# Patient Record
Sex: Male | Born: 1970 | Hispanic: Yes | Smoking: Never smoker
Health system: Southern US, Community
[De-identification: ages and names within clinical notes are randomized; demographics above are authoritative.]

## PROBLEM LIST (undated history)

## (undated) DIAGNOSIS — I10 Essential (primary) hypertension: Secondary | ICD-10-CM

## (undated) DIAGNOSIS — E119 Type 2 diabetes mellitus without complications: Secondary | ICD-10-CM

## (undated) DIAGNOSIS — N289 Disorder of kidney and ureter, unspecified: Secondary | ICD-10-CM

---

## 2021-07-24 ENCOUNTER — Emergency Department: Payer: Self-pay

## 2021-07-24 ENCOUNTER — Emergency Department
Admission: EM | Admit: 2021-07-24 | Discharge: 2021-07-24 | Disposition: A | Discharge status: Home / Self Care | Payer: Self-pay | Attending: Emergency Medicine | Admitting: Emergency Medicine

## 2021-07-24 ENCOUNTER — Encounter: Payer: Self-pay | Admitting: Emergency Medicine

## 2021-07-24 ENCOUNTER — Other Ambulatory Visit: Payer: Self-pay

## 2021-07-24 DIAGNOSIS — J069 Acute upper respiratory infection, unspecified: Secondary | ICD-10-CM | POA: Insufficient documentation

## 2021-07-24 HISTORY — DX: Essential (primary) hypertension: I10

## 2021-07-24 HISTORY — DX: Disorder of kidney and ureter, unspecified: N28.9

## 2021-07-24 HISTORY — DX: Type 2 diabetes mellitus without complications: E11.9

## 2021-07-24 MED ORDER — ONDANSETRON 4 MG PO TBDP
4.0000 mg | ORAL_TABLET | Freq: Once | ORAL | Status: AC
  Administered 2021-07-24: 4 mg via ORAL
  Filled 2021-07-24: qty 1

## 2021-07-24 MED ORDER — HYDROCOD POLI-CHLORPHE POLI ER 10-8 MG/5ML PO SUER: 5.0000 mL | Freq: Two times a day (BID) | ORAL | 0 refills | Status: AC | PRN

## 2021-07-24 NOTE — ED Notes (Signed)
Pt to ED via POV with family stating that he is having fever of 103 this morning, cough, headache, and sore throat. Pt took flu medication last night but has not had any medication this morning. Pt is a peritoneal dialysis pt, last did his treatment last night.   Family made aware of visitation policy.

## 2021-07-24 NOTE — ED Triage Notes (Signed)
Pt here with a cough for 9 days with phlegm and a sore throat. Pt also has a HA. Pt states a 103 fever at home. Pt also having nausea.

## 2021-07-24 NOTE — ED Provider Notes (Signed)
[facility] Provider Note    Event Date/Time   First MD Initiated Contact with Patient 07/24/21 [phone]     (approximate)   History   Fever and Cough   HPI  Thomas Key is a 51 y.o. male   presents to the ED with family complaining of cough for 9 days along with sore throat, headache and fever of 103 this morning.  Patient also complains of nausea but denies any vomiting or diarrhea.  One other family member has similar symptoms in the household.  Patient did get the COVID-vaccine and also was tested for COVID 4 days ago at [facility] which was negative.  Patient denies any previous history of bronchitis or pneumonia.  Patient has history of diabetes and takes Glucotrol XL, history of hypertension and renal disorder      Physical Exam   Triage Vital Signs: ED Triage Vitals  Enc Vitals Group     BP 07/24/21 0946 (!) 134/97     Pulse Rate 07/24/21 0946 66     Resp 07/24/21 0946 16     Temp 07/24/21 0946 98.7 F (37.1 C)     Temp Source 07/24/21 0946 Oral     SpO2 07/24/21 0946 99 %     Weight 07/24/21 0949 186 lb (84.4 kg)     Height 07/24/21 0949 5\' 6"  (1.676 m)     Head Circumference --      Peak Flow --      Pain Score 07/24/21 0949 10     Pain Loc --      Pain Edu? --      Excl. in GC? --     Most recent vital signs: Vitals:   07/24/21 0946  BP: (!) 134/97  Pulse: 66  Resp: 16  Temp: 98.7 F (37.1 C)  SpO2: 99%     General: Awake, no distress.  Alert, talkative, coughing. CV:  Good peripheral perfusion.  Heart regular rate and rhythm. Resp:  Normal effort.  Lungs are clear bilaterally with congested cough heard frequently. Abd:  No distention.  Other:     ED Results / Procedures / Treatments   Labs (all labs ordered are listed, but only abnormal results are displayed) Labs Reviewed - No data to display    RADIOLOGY Chest x-ray 2 view images were reviewed and interpreted by myself as being negative for infiltrate.  Radiology  report is negative for acute cardiopulmonary disease.    PROCEDURES:  Critical Care performed:   Procedures   MEDICATIONS ORDERED IN ED: Medications  ondansetron (ZOFRAN-ODT) disintegrating tablet 4 mg (4 mg Oral Given 07/24/21 1030)     IMPRESSION / MDM / ASSESSMENT AND PLAN / ED COURSE  I reviewed the triage vital signs and the nursing notes.   Differential diagnosis includes, but is not limited to, bronchitis, viral upper respiratory infection, pneumonia.  51 year old male presents to the ED with complaint of cough for the last 9 days that is productive in nature.  Patient states he also has a headache from coughing and reports a fever of 103 at home.  He also reports nausea without vomiting.  He was seen at [facility] the first of the week at which time his COVID swab was negative and patient was prescribed Tessalon Perles which is not helping with his cough.  Chest x-ray noted today without evidence of acute cardiopulmonary changes and was reassuring.  Patient was afebrile and had an O2 sat of 99%.  I discussed with  family and patient with use of the Spanish interpreter on [employer].  He will discontinue taking the Tessalon as he states it is not helping with his cough and began taking Tussionex twice daily as needed for cough.  He was made aware that this medication could cause drowsiness and increase his risk for falling.  He will also follow-up with his PCP if any continued problems or not improving.  Over the weekend he is to return to the emergency department if any severe worsening of his symptoms.      Patient's presentation is most consistent with acute illness / injury with system symptoms.  FINAL CLINICAL IMPRESSION(S) / ED DIAGNOSES   Final diagnoses:  Viral URI with cough     Rx / DC Orders   ED Discharge Orders          Ordered    chlorpheniramine-HYDROcodone (TUSSIONEX PENNKINETIC ER) 10-8 MG/5ML  Every 12 hours PRN        07/24/21 1136             Note:   This document was prepared using Dragon voice recognition software and may include unintentional dictation errors.   Tommi Rumps, PA-C 07/24/21 1412    Minna Antis, MD 07/24/21 1414

## 2021-07-24 NOTE — ED Notes (Signed)
See triage note  per family he has had cough and chest discomfort for the past 9 days  also has had fever   states cough is productive  greenish phlegm   fever at home was 103  afebrile on arrival   also has h/a

## 2021-07-24 NOTE — Discharge Instructions (Signed)
Follow-up with your primary care provider if any continued problems.  Discontinue taking the [employer] as they are not working for your cough.  A prescription for Tussionex was sent to the pharmacy.  As we discussed do not drive or operate machinery while taking this medication as it could cause drowsiness.  Increase fluids.  Continue with your regular medication as prescribed by your primary care provider.

## 2022-07-16 ENCOUNTER — Emergency Department
Admission: EM | Admit: 2022-07-16 | Discharge: 2022-07-16 | Disposition: A | Discharge status: Home / Self Care | Payer: Medicare Other | Attending: Emergency Medicine | Admitting: Emergency Medicine

## 2022-07-16 ENCOUNTER — Other Ambulatory Visit: Payer: Self-pay

## 2022-07-16 ENCOUNTER — Emergency Department: Payer: Medicare Other

## 2022-07-16 DIAGNOSIS — E119 Type 2 diabetes mellitus without complications: Secondary | ICD-10-CM | POA: Diagnosis not present

## 2022-07-16 DIAGNOSIS — R519 Headache, unspecified: Secondary | ICD-10-CM

## 2022-07-16 DIAGNOSIS — Z992 Dependence on renal dialysis: Secondary | ICD-10-CM | POA: Insufficient documentation

## 2022-07-16 DIAGNOSIS — I10 Essential (primary) hypertension: Secondary | ICD-10-CM | POA: Diagnosis not present

## 2022-07-16 LAB — CBC WITH DIFFERENTIAL/PLATELET
Abs Immature Granulocytes: 0.04 10*3/uL (ref 0.00–0.07)
Basophils Absolute: 0.1 10*3/uL (ref 0.0–0.1)
Basophils Relative: 1 %
Eosinophils Absolute: 0.2 10*3/uL (ref 0.0–0.5)
Eosinophils Relative: 2 %
HCT: 36.7 % — ABNORMAL LOW (ref 39.0–52.0)
Hemoglobin: 12.1 g/dL — ABNORMAL LOW (ref 13.0–17.0)
Immature Granulocytes: 0 %
Lymphocytes Relative: 16 %
Lymphs Abs: 1.5 10*3/uL (ref 0.7–4.0)
MCH: 31.5 pg (ref 26.0–34.0)
MCHC: 33 g/dL (ref 30.0–36.0)
MCV: 95.6 fL (ref 80.0–100.0)
Monocytes Absolute: 0.6 10*3/uL (ref 0.1–1.0)
Monocytes Relative: 7 %
Neutro Abs: 6.9 10*3/uL (ref 1.7–7.7)
Neutrophils Relative %: 74 %
Platelets: 182 10*3/uL (ref 150–400)
RBC: 3.84 MIL/uL — ABNORMAL LOW (ref 4.22–5.81)
RDW: 12.9 % (ref 11.5–15.5)
WBC: 9.2 10*3/uL (ref 4.0–10.5)
nRBC: 0 % (ref 0.0–0.2)

## 2022-07-16 LAB — COMPREHENSIVE METABOLIC PANEL
ALT: 23 U/L (ref 0–44)
AST: 18 U/L (ref 15–41)
Albumin: 4.1 g/dL (ref 3.5–5.0)
Alkaline Phosphatase: 180 U/L — ABNORMAL HIGH (ref 38–126)
Anion gap: 18 — ABNORMAL HIGH (ref 5–15)
BUN: 65 mg/dL — ABNORMAL HIGH (ref 6–20)
CO2: 24 mmol/L (ref 22–32)
Calcium: 8.8 mg/dL — ABNORMAL LOW (ref 8.9–10.3)
Chloride: 91 mmol/L — ABNORMAL LOW (ref 98–111)
Creatinine, Ser: 15.94 mg/dL — ABNORMAL HIGH (ref 0.61–1.24)
GFR, Estimated: 3 mL/min — ABNORMAL LOW (ref >60)
Glucose, Bld: 157 mg/dL — ABNORMAL HIGH (ref 70–99)
Potassium: 4.3 mmol/L (ref 3.5–5.1)
Sodium: 133 mmol/L — ABNORMAL LOW (ref 135–145)
Total Bilirubin: 0.8 mg/dL (ref 0.3–1.2)
Total Protein: 7.5 g/dL (ref 6.5–8.1)

## 2022-07-16 MED ORDER — KETOROLAC TROMETHAMINE 15 MG/ML IJ SOLN
15.0000 mg | Freq: Once | INTRAMUSCULAR | Status: AC
  Administered 2022-07-16: 15 mg via INTRAVENOUS
  Filled 2022-07-16: qty 1

## 2022-07-16 NOTE — ED Provider Notes (Signed)
[facility] Provider Note    Event Date/Time   First MD Initiated Contact with Patient 07/16/22 1158     (approximate)   History   Headache   HPI  Thomas Key is a 52 y.o. male with a past medical history of diabetes, hypertension, on daily peritoneal dialysis, who presents today for evaluation of headache for the past 2 days.  Patient reports that the headache is constant, though intermittently gets better and worse.  He is unable to identify anything that makes it better or worse.  He reports that he has overall gradually been getting worse.  He denies any vision changes, dizziness, neck pain or stiffness, fevers or chills.  No ear pain.  He took Tylenol without improvement of his symptoms.  He reports that he took his blood pressure and noticed that it was 198 systolic when he had a headache.  There are no problems to display for this patient.         Physical Exam   Triage Vital Signs: ED Triage Vitals [07/16/22 1137]  Enc Vitals Group     BP (!) 129/93     Pulse Rate (!) 102     Resp 20     Temp 97.7 F (36.5 C)     Temp src      SpO2 98 %     Weight      Height      Head Circumference      Peak Flow      Pain Score      Pain Loc      Pain Edu?      Excl. in GC?     Most recent vital signs: Vitals:   07/16/22 1137  BP: (!) 129/93  Pulse: (!) 102  Resp: 20  Temp: 97.7 F (36.5 C)  SpO2: 98%    Physical Exam Vitals and nursing note reviewed.  Constitutional:      General: Awake and alert. No acute distress.    Appearance: Normal appearance. The patient is normal weight.  HENT:     Head: Normocephalic and atraumatic.     Mouth: Mucous membranes are moist.  TMs clear bilaterally, no mastoid tenderness or erythema Eyes:     General: PERRL. Normal EOMs.  No temporal artery tenderness        Right eye: No discharge.        Left eye: No discharge.     Conjunctiva/sclera: Conjunctivae normal.  Cardiovascular:     Rate and  Rhythm: Normal rate    Pulses: Normal pulses.  Pulmonary:     Effort: Pulmonary effort is normal. No respiratory distress.     Breath sounds: Normal breath sounds.  Abdominal:     Abdomen is soft. There is no abdominal tenderness. No rebound or guarding. No distention. Musculoskeletal:        General: No swelling. Normal range of motion.     Cervical back: Normal range of motion and neck supple.  Skin:    General: Skin is warm and dry.     Capillary Refill: Capillary refill takes less than 2 seconds.     Findings: No rash.  Neurological:     Mental Status: The patient is awake and alert.   Neurological: GCS 15 alert and oriented x3 Normal speech, no expressive or receptive aphasia or dysarthria Cranial nerves II through XII intact Normal visual fields 5 out of 5 strength in all 4 extremities with intact sensation throughout No  extremity drift Normal finger-to-nose testing, no limb or truncal ataxia   ED Results / Procedures / Treatments   Labs (all labs ordered are listed, but only abnormal results are displayed) Labs Reviewed  CBC WITH DIFFERENTIAL/PLATELET - Abnormal; Notable for the following components:      Result Value   RBC 3.84 (*)    Hemoglobin 12.1 (*)    HCT 36.7 (*)    All other components within normal limits  COMPREHENSIVE METABOLIC PANEL - Abnormal; Notable for the following components:   Sodium 133 (*)    Chloride 91 (*)    Glucose, Bld 157 (*)    BUN 65 (*)    Creatinine, Ser 15.94 (*)    Calcium 8.8 (*)    Alkaline Phosphatase 180 (*)    GFR, Estimated 3 (*)    Anion gap 18 (*)    All other components within normal limits     EKG     RADIOLOGY I independently reviewed and interpreted imaging and agree with radiologists findings.     PROCEDURES:  Critical Care performed:   Procedures   MEDICATIONS ORDERED IN ED: Medications  ketorolac (TORADOL) 15 MG/ML injection 15 mg (15 mg Intravenous Given 07/16/22 1255)     IMPRESSION /  MDM / ASSESSMENT AND PLAN / ED COURSE  I reviewed the triage vital signs and the nursing notes.   Differential diagnosis includes, but is not limited to, migraine, intracranial hemorrhage, tension headache.  Patient is awake and alert, hemodynamically stable and afebrile.  Patient presented with a chief concern of a headache. Gradual in onset, without history or physical exam findings to suggest encephalopathy; no altered mental status, fever or meningismus, vision changes, vomiting or focal neurological deficit and improved with treatment in the emergency department. Therefore, I have low suspicion for concerning process that would require urgent or emergent imaging or diagnostic/therapeutic procedural intervention such as lumbar puncture. Doubt meningitis as there is no fever, photophobia, neck symptoms, altered mental status.  No history of trauma, doubt subdural or epidural hematoma. No dizziness or other neurologic symptoms so cerebellar infarction or other hemorrhagic stroke are unlikely. Intracranial mass unlikely given that the headache is not getting progressively worse, is not worse in the morning, there are no other neurologic symptoms, and the neurologic exam is grossly normal. Unlikely to be giant cell arteritis as there is no tenderness over temporal artery or vision changes. Doubt CO toxicity as no known exposure and no other family members have a headache. No neck pain and was not sudden onset or associated with movement of the neck and no dizziness,  doubt carotid artery dissection. No occipital tenderness so occipital neuralgia seems less likely.  Given that he does not typically get headaches, CT head obtained is negative.  He was treated symptomatically with resolution of his headache.  He was given Toradol given that he has dialysis every day and kidney damage is not an acute concern given that he is already on dialysis.  We discussed that it is possible that it was due to his blood  pressure given that when he had a headache he took his blood pressure and it was 198 systolic.  His blood pressure is reasonable here.  He has no focal neurological deficits, and is quite well in appearance.  I discussed the importance of close outpatient follow-up and strict return precautions.  Patient understands and agrees with plan.  He was discharged in stable condition.     Patient's presentation is most  consistent with acute complicated illness / injury requiring diagnostic workup.   Clinical Course as of 07/16/22 1526  Fri Jul 16, 2022  1303 Patient reports headache has improved [JP]    Clinical Course User Index [JP] Nalanie Winiecki, Herb Grays, PA-C     FINAL CLINICAL IMPRESSION(S) / ED DIAGNOSES   Final diagnoses:  Acute nonintractable headache, unspecified headache type     Rx / DC Orders   ED Discharge Orders     None        Note:  This document was prepared using Dragon voice recognition software and may include unintentional dictation errors.   Jackelyn Hoehn, PA-C 07/16/22 1526    Corena Herter, MD 07/16/22 1538

## 2022-07-16 NOTE — ED Triage Notes (Addendum)
Pt c/o headache on right side of head and mild blurry vision x3 days. Upper and lower strength equal bilaterally, PERRLA noted. Pt denies dizziness, CP, N/V/D. BGL was 123 at home today, pt denies history of migraines- is dialysis pt.

## 2022-07-16 NOTE — Discharge Instructions (Signed)
Your CT scan was normal.  Please follow-up with your outpatient provider.  Please return for any new, worsening, or changes symptoms or other concerns including worsening headache, visual changes, numbness, tingling, weakness, vomiting, neck pain, dizziness, or any other concerns.

## 2022-08-26 ENCOUNTER — Emergency Department: Payer: Medicare Other

## 2022-08-26 ENCOUNTER — Other Ambulatory Visit: Payer: Self-pay

## 2022-08-26 ENCOUNTER — Emergency Department
Admission: EM | Admit: 2022-08-26 | Discharge: 2022-08-26 | Disposition: A | Discharge status: Home / Self Care | Payer: Medicare Other | Attending: Emergency Medicine | Admitting: Emergency Medicine

## 2022-08-26 DIAGNOSIS — N186 End stage renal disease: Secondary | ICD-10-CM | POA: Diagnosis not present

## 2022-08-26 DIAGNOSIS — I120 Hypertensive chronic kidney disease with stage 5 chronic kidney disease or end stage renal disease: Secondary | ICD-10-CM | POA: Insufficient documentation

## 2022-08-26 DIAGNOSIS — R0789 Other chest pain: Secondary | ICD-10-CM | POA: Diagnosis present

## 2022-08-26 LAB — CBC
HCT: 32.6 % — ABNORMAL LOW (ref 39.0–52.0)
Hemoglobin: 10.7 g/dL — ABNORMAL LOW (ref 13.0–17.0)
MCH: 31.1 pg (ref 26.0–34.0)
MCHC: 32.8 g/dL (ref 30.0–36.0)
MCV: 94.8 fL (ref 80.0–100.0)
Platelets: 179 10*3/uL (ref 150–400)
RBC: 3.44 MIL/uL — ABNORMAL LOW (ref 4.22–5.81)
RDW: 12.8 % (ref 11.5–15.5)
WBC: 9.1 10*3/uL (ref 4.0–10.5)
nRBC: 0 % (ref 0.0–0.2)

## 2022-08-26 LAB — BASIC METABOLIC PANEL
Anion gap: 18 — ABNORMAL HIGH (ref 5–15)
BUN: 53 mg/dL — ABNORMAL HIGH (ref 6–20)
CO2: 24 mmol/L (ref 22–32)
Calcium: 8.3 mg/dL — ABNORMAL LOW (ref 8.9–10.3)
Chloride: 88 mmol/L — ABNORMAL LOW (ref 98–111)
Creatinine, Ser: 16.57 mg/dL — ABNORMAL HIGH (ref 0.61–1.24)
GFR, Estimated: 3 mL/min — ABNORMAL LOW (ref >60)
Glucose, Bld: 106 mg/dL — ABNORMAL HIGH (ref 70–99)
Potassium: 3.7 mmol/L (ref 3.5–5.1)
Sodium: 130 mmol/L — ABNORMAL LOW (ref 135–145)

## 2022-08-26 LAB — TROPONIN I (HIGH SENSITIVITY)
Troponin I (High Sensitivity): 22 ng/L — ABNORMAL HIGH (ref <18)
Troponin I (High Sensitivity): 23 ng/L — ABNORMAL HIGH (ref <18)

## 2022-08-26 NOTE — ED Triage Notes (Signed)
Pt to ED via POV c/o central CP that started suddenly ago. Pt reports pressure in middle of chest taht doesn't radiate anywhere and worsens with movement. Pt is dialysis pt, does dialysis every night. Endorses some nausea. Denies SOB, fevers, dizziness.

## 2022-08-26 NOTE — ED Provider Notes (Signed)
[facility] Provider Note    Event Date/Time   First MD Initiated Contact with Patient 08/26/22 2152     (approximate)   History   Chest Pain   HPI  Thomas Key is a 52 y.o. male with a history of high blood pressure and end-stage renal disease presents with complaints of chest pain.  Patient reports that started about 30 minutes prior to arrival.  He notes pain is worse when he stretches out his chest.  Denies shortness of breath, no lower extremity swelling or pain.  No history of heart disease reported.  No fevers chills or cough.     Physical Exam   Triage Vital Signs: ED Triage Vitals  Encounter Vitals Group     BP 08/26/22 1946 (!) 173/110     Systolic BP Percentile --      Diastolic BP Percentile --      Pulse Rate 08/26/22 1946 93     Resp 08/26/22 1946 18     Temp 08/26/22 1946 98.3 F (36.8 C)     Temp Source 08/26/22 1946 Oral     SpO2 08/26/22 1946 100 %     Weight 08/26/22 1947 83.9 kg (185 lb)     Height 08/26/22 1947 1.676 m (5\' 6" )     Head Circumference --      Peak Flow --      Pain Score 08/26/22 1946 8     Pain Loc --      Pain Education --      Exclude from Growth Chart --     Most recent vital signs: Vitals:   08/26/22 1946 08/26/22 2230  BP: (!) 173/110 (!) 196/118  Pulse: 93 88  Resp: 18 18  Temp: 98.3 F (36.8 C)   SpO2: 100% 100%     General: Awake, no distress.  CV:  Good peripheral perfusion.  Mild chest wall tenderness to palpation around the sternum bilaterally Resp:  Normal effort.  Abd:  No distention.  Other:  No lower extremity swelling   ED Results / Procedures / Treatments   Labs (all labs ordered are listed, but only abnormal results are displayed) Labs Reviewed  BASIC METABOLIC PANEL - Abnormal; Notable for the following components:      Result Value   Sodium 130 (*)    Chloride 88 (*)    Glucose, Bld 106 (*)    BUN 53 (*)    Creatinine, Ser 16.57 (*)    Calcium 8.3 (*)    GFR,  Estimated 3 (*)    Anion gap 18 (*)    All other components within normal limits  CBC - Abnormal; Notable for the following components:   RBC 3.44 (*)    Hemoglobin 10.7 (*)    HCT 32.6 (*)    All other components within normal limits  TROPONIN I (HIGH SENSITIVITY) - Abnormal; Notable for the following components:   Troponin I (High Sensitivity) 22 (*)    All other components within normal limits  TROPONIN I (HIGH SENSITIVITY) - Abnormal; Notable for the following components:   Troponin I (High Sensitivity) 23 (*)    All other components within normal limits     EKG  ED ECG REPORT I, Jene Every, the attending physician, personally viewed and interpreted this ECG.  Date: 08/26/2022  Rhythm: normal sinus rhythm QRS Axis: normal Intervals: normal ST/T Wave abnormalities: normal Narrative Interpretation: no evidence of acute ischemia    RADIOLOGY Chest x-ray  viewed interpret by me, no acute abnormality    PROCEDURES:  Critical Care performed:   Procedures   MEDICATIONS ORDERED IN ED: Medications - No data to display   IMPRESSION / MDM / ASSESSMENT AND PLAN / ED COURSE  I reviewed the triage vital signs and the nursing notes. Patient's presentation is most consistent with acute presentation with potential threat to life or bodily function.  Patient presents with chest pain as detailed above, differential includes musculoskeletal chest pain, ACS, pneumonia  EKG is reassuring, initial high sensitive troponin is mildly elevated.  Suspect mildly elevated troponin related to end-stage renal disease, will obtain delta troponin  In room patient is asymptomatic and states his chest pain has resolved  Delta troponin is unchanged from prior.  Appropriate for discharge at this time with cardiology follow-up, strict return precautions discussed, patient agrees with this plan.        FINAL CLINICAL IMPRESSION(S) / ED DIAGNOSES   Final diagnoses:  Atypical chest  pain     Rx / DC Orders   ED Discharge Orders     None        Note:  This document was prepared using Dragon voice recognition software and may include unintentional dictation errors.   Jene Every, MD 08/26/22 [phone]

## 2023-03-12 IMAGING — CR DG CHEST 2V
2 series · 2 of 2 positions shown · non-contrast
Comparison: None

CLINICAL DATA: Cough, fever

EXAM:
CHEST - 2 VIEW

[chest pa]
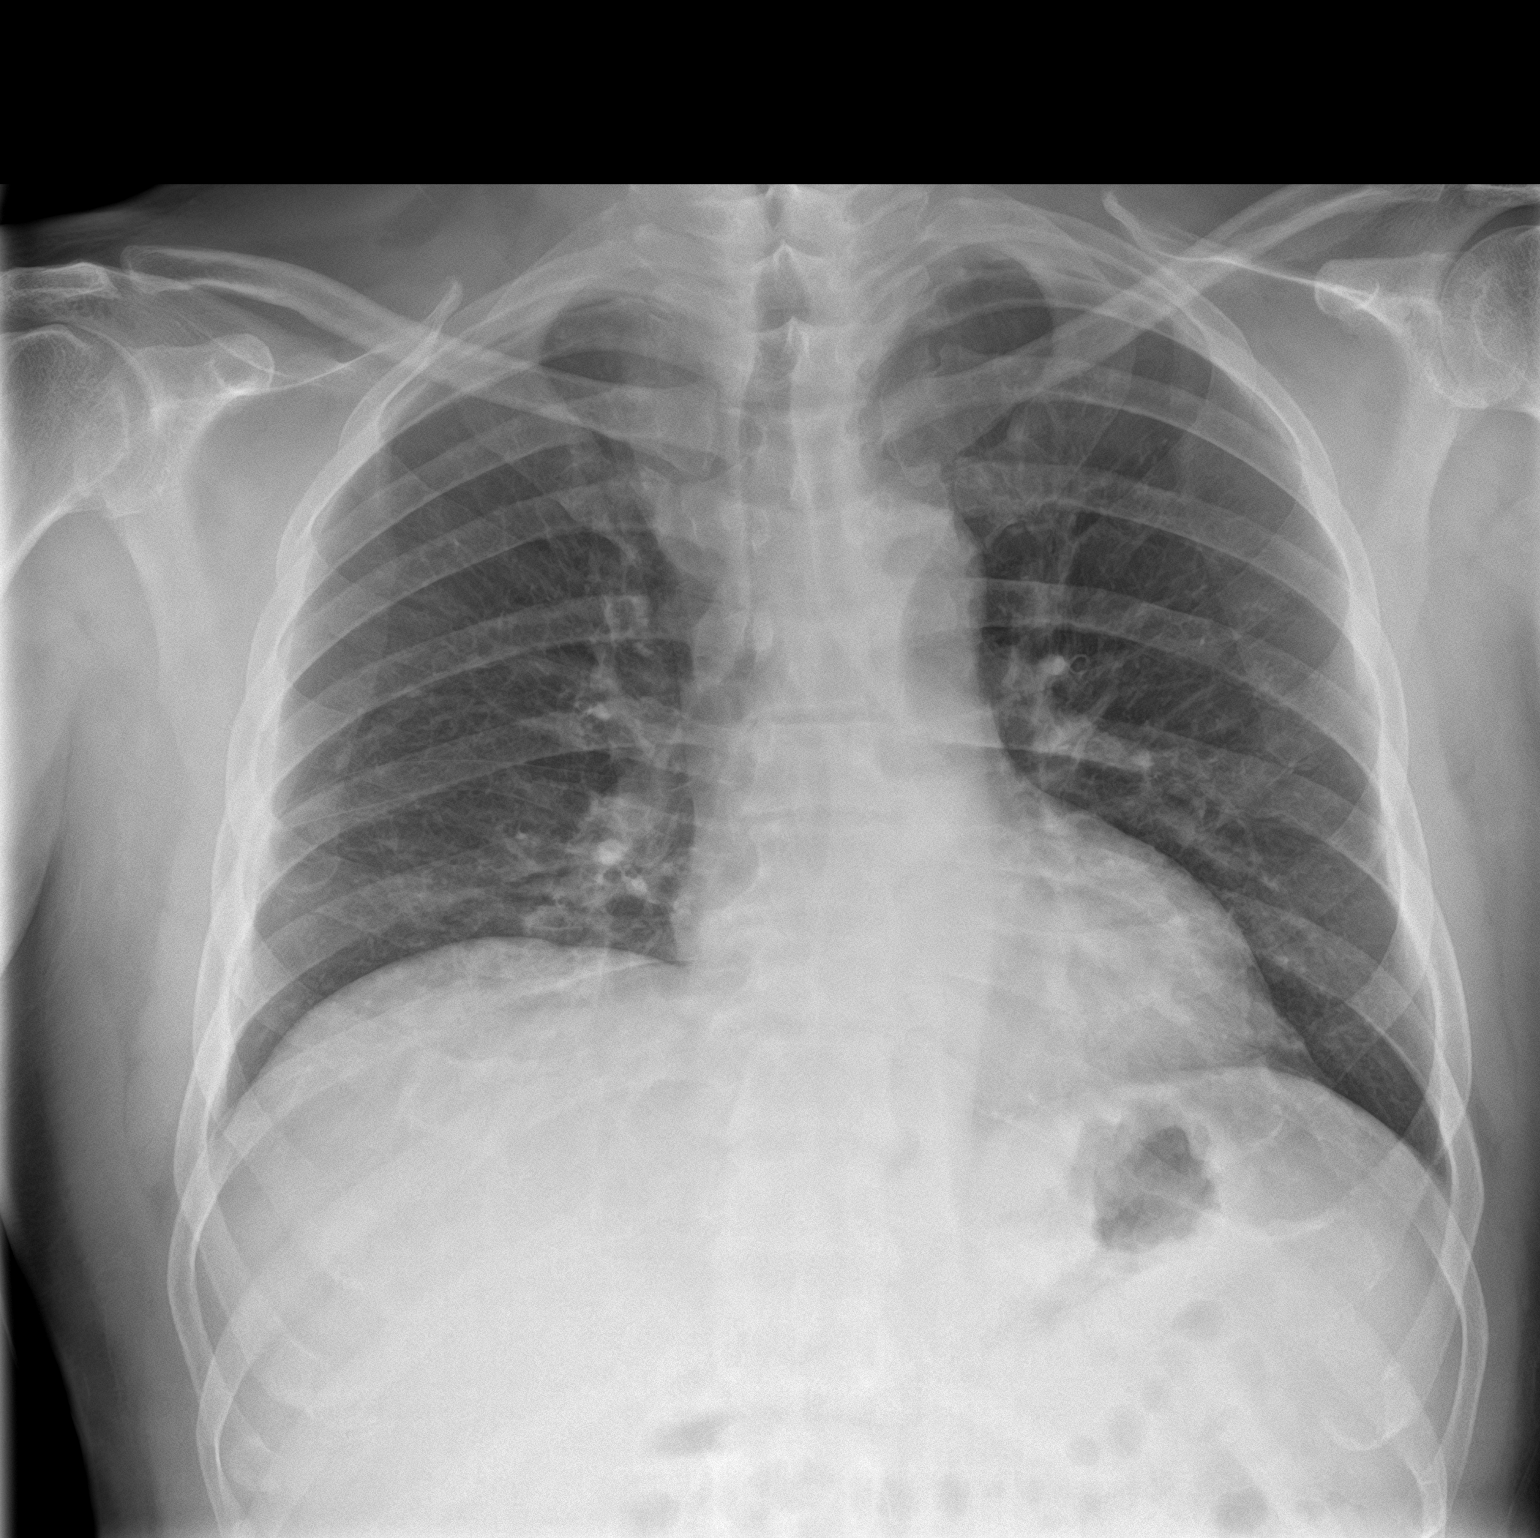

[chest lat]
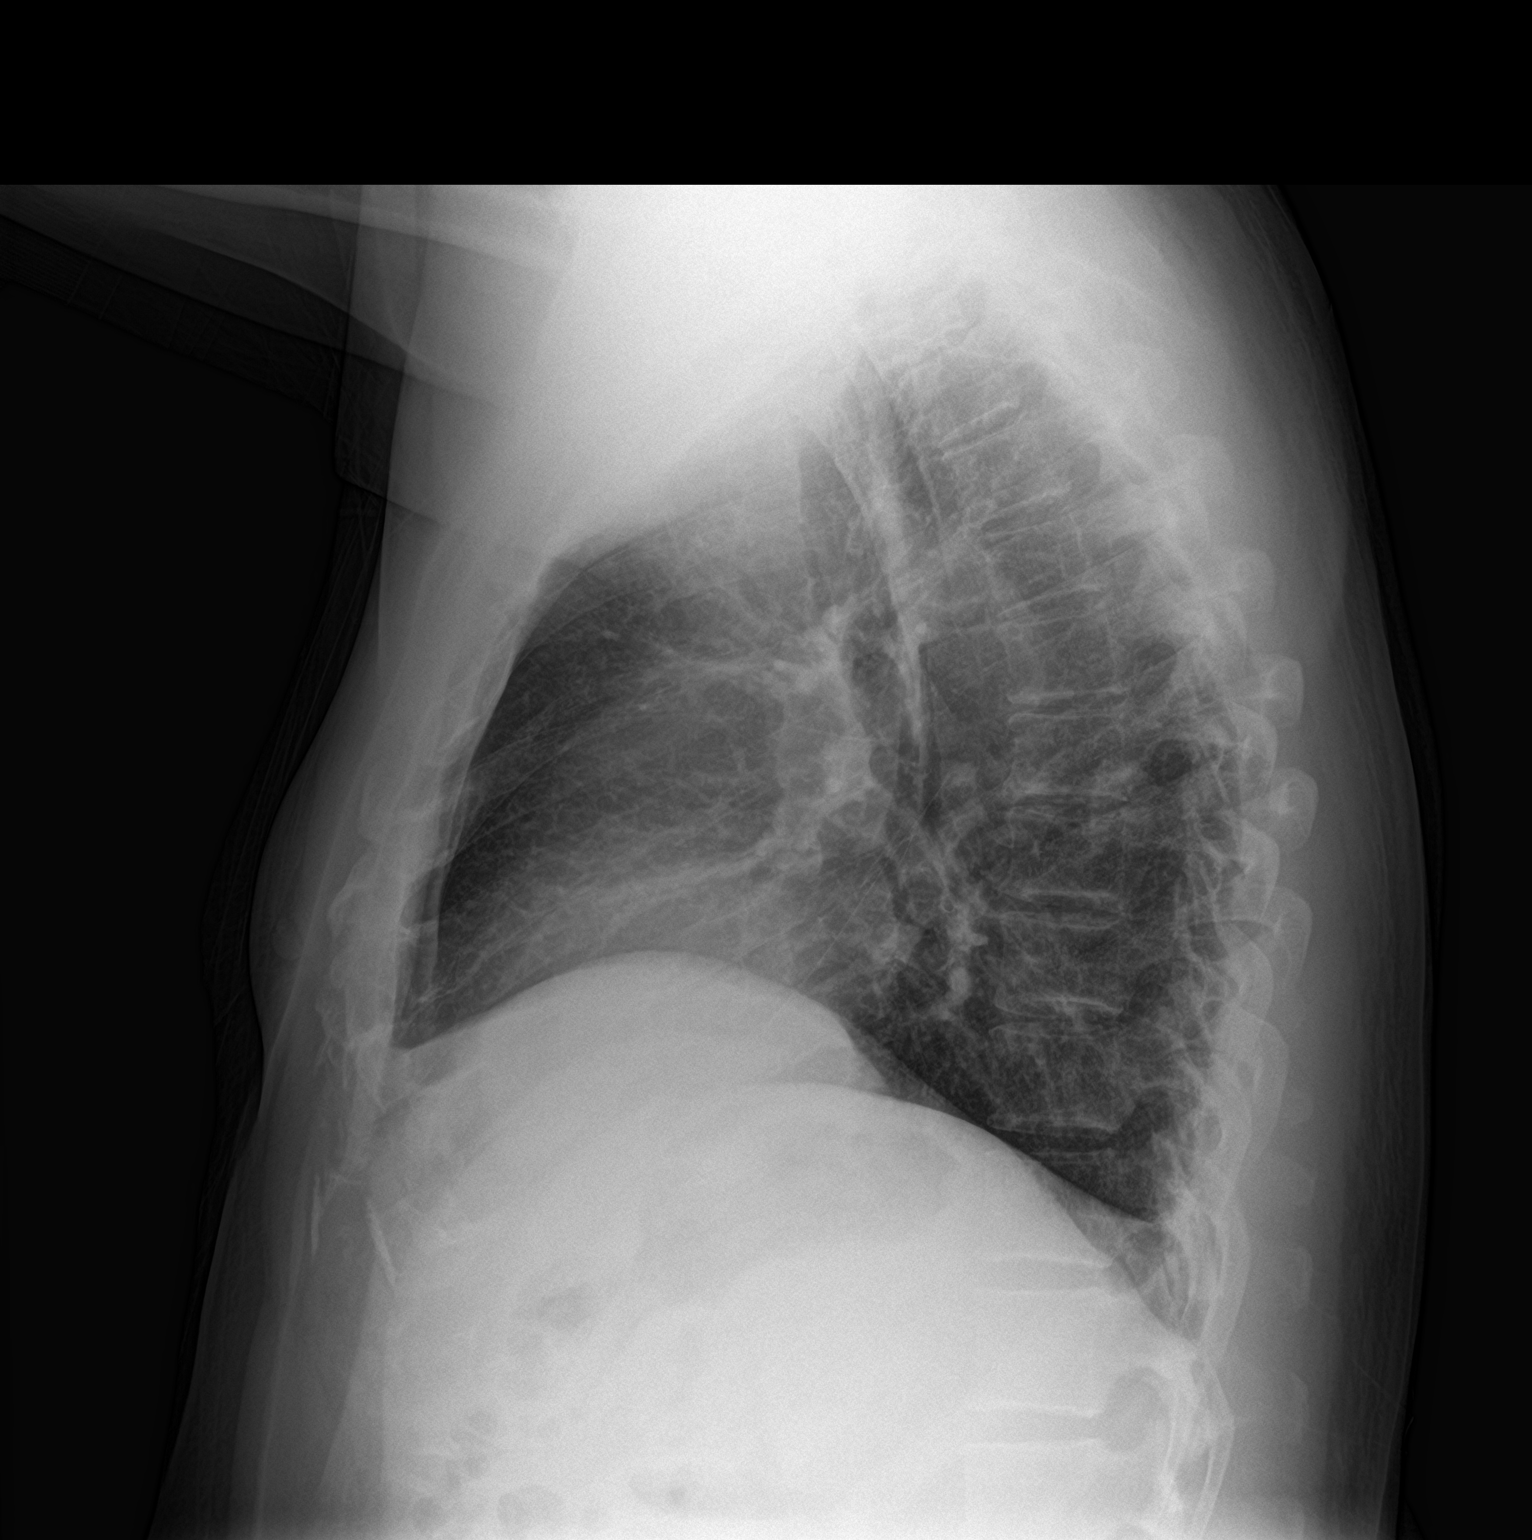

[2 of 2 positions shown; findings below may reference images not displayed]

FINDINGS: The cardiomediastinal silhouette is within normal limits. There is
no focal airspace disease. There is no pleural effusion. No
pneumothorax. There is no acute osseous abnormality.
IMPRESSION: No evidence of acute cardiopulmonary disease.
